# Patient Record
Sex: Female | Born: 2000 | Race: White | Hispanic: No | Marital: Single | Smoking: Never smoker
Health system: Southern US, Community
[De-identification: ages and names within clinical notes are randomized; demographics above are authoritative.]

---
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
## 2023-04-11 DIAGNOSIS — J039 Acute tonsillitis, unspecified: Secondary | ICD-10-CM | POA: Diagnosis not present

## 2023-10-12 DIAGNOSIS — J189 Pneumonia, unspecified organism: Secondary | ICD-10-CM | POA: Diagnosis not present

## 2023-11-23 DIAGNOSIS — F4010 Social phobia, unspecified: Secondary | ICD-10-CM | POA: Diagnosis not present

## 2023-11-30 DIAGNOSIS — F4010 Social phobia, unspecified: Secondary | ICD-10-CM | POA: Diagnosis not present

## 2023-12-07 DIAGNOSIS — F4010 Social phobia, unspecified: Secondary | ICD-10-CM | POA: Diagnosis not present

## 2023-12-14 DIAGNOSIS — F4010 Social phobia, unspecified: Secondary | ICD-10-CM | POA: Diagnosis not present

## 2023-12-17 DIAGNOSIS — J029 Acute pharyngitis, unspecified: Secondary | ICD-10-CM | POA: Diagnosis not present

## 2023-12-28 DIAGNOSIS — F4010 Social phobia, unspecified: Secondary | ICD-10-CM | POA: Diagnosis not present

## 2024-01-11 DIAGNOSIS — F4010 Social phobia, unspecified: Secondary | ICD-10-CM | POA: Diagnosis not present

## 2024-02-08 DIAGNOSIS — F4010 Social phobia, unspecified: Secondary | ICD-10-CM | POA: Diagnosis not present

## 2024-02-22 DIAGNOSIS — F4010 Social phobia, unspecified: Secondary | ICD-10-CM | POA: Diagnosis not present

## 2024-03-07 DIAGNOSIS — F4010 Social phobia, unspecified: Secondary | ICD-10-CM | POA: Diagnosis not present

## 2024-03-29 DIAGNOSIS — F4010 Social phobia, unspecified: Secondary | ICD-10-CM | POA: Diagnosis not present

## 2024-04-04 DIAGNOSIS — F4010 Social phobia, unspecified: Secondary | ICD-10-CM | POA: Diagnosis not present

## 2024-04-20 DIAGNOSIS — F4010 Social phobia, unspecified: Secondary | ICD-10-CM | POA: Diagnosis not present

## 2024-05-03 DIAGNOSIS — F4010 Social phobia, unspecified: Secondary | ICD-10-CM | POA: Diagnosis not present

## 2024-05-17 DIAGNOSIS — F4010 Social phobia, unspecified: Secondary | ICD-10-CM | POA: Diagnosis not present

## 2024-06-07 DIAGNOSIS — F4010 Social phobia, unspecified: Secondary | ICD-10-CM | POA: Diagnosis not present

## 2024-06-30 DIAGNOSIS — F4010 Social phobia, unspecified: Secondary | ICD-10-CM | POA: Diagnosis not present

## 2024-08-09 DIAGNOSIS — F4010 Social phobia, unspecified: Secondary | ICD-10-CM | POA: Diagnosis not present
